# Patient Record
Sex: Female | Born: 1940 | Race: White | Hispanic: No | Marital: Married
Health system: Southern US, Community
[De-identification: ages and names within clinical notes are randomized; demographics above are authoritative.]

---

## 2006-09-01 ENCOUNTER — Emergency Department: Payer: Self-pay | Admitting: Emergency Medicine

## 2006-09-01 ENCOUNTER — Other Ambulatory Visit: Payer: Self-pay

## 2010-12-08 ENCOUNTER — Ambulatory Visit: Payer: Self-pay | Admitting: Oncology

## 2010-12-26 ENCOUNTER — Ambulatory Visit: Payer: Self-pay | Admitting: Oncology

## 2011-01-06 ENCOUNTER — Ambulatory Visit: Payer: Self-pay | Admitting: Oncology

## 2011-01-20 ENCOUNTER — Ambulatory Visit: Payer: Self-pay | Admitting: General Surgery

## 2011-01-22 ENCOUNTER — Ambulatory Visit: Payer: Self-pay | Admitting: General Surgery

## 2011-01-25 ENCOUNTER — Ambulatory Visit: Payer: Self-pay | Admitting: Oncology

## 2011-02-25 ENCOUNTER — Ambulatory Visit: Payer: Self-pay | Admitting: Oncology

## 2011-03-27 ENCOUNTER — Ambulatory Visit: Payer: Self-pay | Admitting: Oncology

## 2011-04-27 ENCOUNTER — Ambulatory Visit: Payer: Self-pay | Admitting: Oncology

## 2011-04-29 LAB — CBC CANCER CENTER
Basophil %: 0.4 %
Eosinophil #: 0 x10 3/mm (ref 0.0–0.7)
Eosinophil %: 5 %
HGB: 7.5 g/dL — ABNORMAL LOW (ref 12.0–16.0)
Lymphocyte #: 0.2 x10 3/mm — ABNORMAL LOW (ref 1.0–3.6)
Lymphocyte %: 43.8 %
MCHC: 33 g/dL (ref 32.0–36.0)
Monocyte #: 0.1 x10 3/mm (ref 0.0–0.7)
Neutrophil #: 0.1 x10 3/mm — ABNORMAL LOW (ref 1.4–6.5)
Neutrophil %: 24 %
WBC: 0.5 x10 3/mm — CL (ref 3.6–11.0)

## 2011-04-29 LAB — COMPREHENSIVE METABOLIC PANEL
Albumin: 3.4 g/dL (ref 3.4–5.0)
Anion Gap: 10 (ref 7–16)
BUN: 11 mg/dL (ref 7–18)
Bilirubin,Total: 0.4 mg/dL (ref 0.2–1.0)
Chloride: 96 mmol/L — ABNORMAL LOW (ref 98–107)
Creatinine: 0.95 mg/dL (ref 0.60–1.30)
EGFR (African American): >60
Glucose: 199 mg/dL — ABNORMAL HIGH (ref 65–99)
Potassium: 3.4 mmol/L — ABNORMAL LOW (ref 3.5–5.1)
SGOT(AST): 6 U/L — ABNORMAL LOW (ref 15–37)
SGPT (ALT): 15 U/L
Sodium: 135 mmol/L — ABNORMAL LOW (ref 136–145)
Total Protein: 5.8 g/dL — ABNORMAL LOW (ref 6.4–8.2)

## 2011-05-12 LAB — CBC CANCER CENTER
Basophil #: 0 x10 3/mm (ref 0.0–0.1)
HCT: 29 % — ABNORMAL LOW (ref 35.0–47.0)
Lymphocyte %: 11.7 %
Monocyte %: 6.8 %
Neutrophil #: 5.4 x10 3/mm (ref 1.4–6.5)
Neutrophil %: 81.1 %
Platelet: 155 x10 3/mm (ref 150–440)
RBC: 3.17 10*6/uL — ABNORMAL LOW (ref 3.80–5.20)
RDW: 15.8 % — ABNORMAL HIGH (ref 11.5–14.5)
WBC: 6.7 x10 3/mm (ref 3.6–11.0)

## 2011-05-12 LAB — COMPREHENSIVE METABOLIC PANEL
Albumin: 4 g/dL (ref 3.4–5.0)
Alkaline Phosphatase: 63 U/L (ref 50–136)
Bilirubin,Total: 0.2 mg/dL (ref 0.2–1.0)
Chloride: 96 mmol/L — ABNORMAL LOW (ref 98–107)
Co2: 28 mmol/L (ref 21–32)
Creatinine: 0.89 mg/dL (ref 0.60–1.30)
EGFR (African American): >60
EGFR (Non-African Amer.): >60
Glucose: 208 mg/dL — ABNORMAL HIGH (ref 65–99)
SGOT(AST): 16 U/L (ref 15–37)
SGPT (ALT): 19 U/L

## 2011-05-19 LAB — CBC CANCER CENTER
Basophil #: 0 x10 3/mm (ref 0.0–0.1)
Eosinophil #: 0 x10 3/mm (ref 0.0–0.7)
HCT: 26.5 % — ABNORMAL LOW (ref 35.0–47.0)
MCH: 30.1 pg (ref 26.0–34.0)
MCHC: 32.8 g/dL (ref 32.0–36.0)
MCV: 91.7 fL (ref 80–100)
Monocyte #: 0 x10 3/mm (ref 0.0–0.7)
Neutrophil #: 0 x10 3/mm — ABNORMAL LOW (ref 1.4–6.5)
Neutrophil %: 7.5 %
Platelet: 15 x10 3/mm — CL (ref 150–440)
RBC: 2.89 10*6/uL — ABNORMAL LOW (ref 3.80–5.20)
RDW: 15.2 % — ABNORMAL HIGH (ref 11.5–14.5)

## 2011-05-19 LAB — BASIC METABOLIC PANEL
Calcium, Total: 9 mg/dL (ref 8.5–10.1)
Chloride: 96 mmol/L — ABNORMAL LOW (ref 98–107)
Creatinine: 1.03 mg/dL (ref 0.60–1.30)
EGFR (Non-African Amer.): 56 — ABNORMAL LOW
Glucose: 199 mg/dL — ABNORMAL HIGH (ref 65–99)
Osmolality: 278 (ref 275–301)
Potassium: 3.8 mmol/L (ref 3.5–5.1)
Sodium: 135 mmol/L — ABNORMAL LOW (ref 136–145)

## 2011-05-28 ENCOUNTER — Ambulatory Visit: Payer: Self-pay | Admitting: Oncology

## 2011-06-25 ENCOUNTER — Ambulatory Visit: Payer: Self-pay | Admitting: Oncology

## 2011-07-07 LAB — CREATININE, SERUM
Creatinine: 0.85 mg/dL (ref 0.60–1.30)
EGFR (African American): >60

## 2011-07-26 ENCOUNTER — Ambulatory Visit: Payer: Self-pay | Admitting: Oncology

## 2011-07-28 LAB — CBC CANCER CENTER
Basophil #: 0 x10 3/mm (ref 0.0–0.1)
Basophil %: 0.3 %
Eosinophil #: 0.1 x10 3/mm (ref 0.0–0.7)
Eosinophil %: 1.4 %
HGB: 11.6 g/dL — ABNORMAL LOW (ref 12.0–16.0)
Lymphocyte %: 16 %
MCH: 29.4 pg (ref 26.0–34.0)
MCHC: 32.5 g/dL (ref 32.0–36.0)
Monocyte %: 8.1 %
Neutrophil #: 4.8 x10 3/mm (ref 1.4–6.5)
Neutrophil %: 74.2 %
Platelet: 181 x10 3/mm (ref 150–440)
RBC: 3.94 10*6/uL (ref 3.80–5.20)
RDW: 12.9 % (ref 11.5–14.5)
WBC: 6.4 x10 3/mm (ref 3.6–11.0)

## 2011-07-28 LAB — LACTATE DEHYDROGENASE: LDH: 164 U/L (ref 84–246)

## 2011-08-25 ENCOUNTER — Ambulatory Visit: Payer: Self-pay | Admitting: Oncology

## 2011-09-22 LAB — CBC CANCER CENTER
Basophil #: 0 x10 3/mm (ref 0.0–0.1)
Eosinophil #: 0 x10 3/mm (ref 0.0–0.7)
Lymphocyte %: 20 %
MCV: 88 fL (ref 80–100)
Monocyte #: 0.5 x10 3/mm (ref 0.2–0.9)
Monocyte %: 14.4 %
Neutrophil %: 63.5 %
Platelet: 156 x10 3/mm (ref 150–440)
RBC: 4.07 10*6/uL (ref 3.80–5.20)
WBC: 3.4 x10 3/mm — ABNORMAL LOW (ref 3.6–11.0)

## 2011-09-22 LAB — COMPREHENSIVE METABOLIC PANEL
Albumin: 4 g/dL (ref 3.4–5.0)
Alkaline Phosphatase: 65 U/L (ref 50–136)
Chloride: 101 mmol/L (ref 98–107)
Co2: 28 mmol/L (ref 21–32)
Creatinine: 0.91 mg/dL (ref 0.60–1.30)
EGFR (Non-African Amer.): >60
Osmolality: 281 (ref 275–301)
SGOT(AST): 16 U/L (ref 15–37)
SGPT (ALT): 21 U/L
Sodium: 138 mmol/L (ref 136–145)

## 2011-09-25 ENCOUNTER — Ambulatory Visit: Payer: Self-pay | Admitting: Oncology

## 2011-10-13 ENCOUNTER — Ambulatory Visit: Payer: Self-pay | Admitting: Oncology

## 2011-11-01 ENCOUNTER — Ambulatory Visit: Payer: Self-pay | Admitting: Oncology

## 2011-11-05 LAB — CBC CANCER CENTER
Comment - H1-Com3: NORMAL
Eosinophil: 2 %
HCT: 37.5 %
HGB: 12.2 g/dL
Lymphocytes: 17 %
MCH: 28.4 pg
MCHC: 32.6 g/dL
MCV: 87 fL
Monocytes: 4 %
Platelet: 175 "x10 3/mm "
RBC: 4.3 "x10 6/mm "
RDW: 13.8 %
Segmented Neutrophils: 77 %
WBC: 7 "x10 3/mm "

## 2011-11-22 LAB — CBC CANCER CENTER
Basophil #: 0 x10 3/mm (ref 0.0–0.1)
Basophil %: 0.5 %
Eosinophil #: 0.1 x10 3/mm (ref 0.0–0.7)
Lymphocyte #: 1 x10 3/mm (ref 1.0–3.6)
Lymphocyte %: 12.8 %
MCHC: 33.3 g/dL (ref 32.0–36.0)
MCV: 88 fL (ref 80–100)
Monocyte #: 0.6 x10 3/mm (ref 0.2–0.9)
Neutrophil #: 5.9 x10 3/mm (ref 1.4–6.5)
Platelet: 191 x10 3/mm (ref 150–440)
RBC: 3.88 10*6/uL (ref 3.80–5.20)
RDW: 13.8 % (ref 11.5–14.5)
WBC: 7.6 x10 3/mm (ref 3.6–11.0)

## 2011-11-22 LAB — COMPREHENSIVE METABOLIC PANEL
Albumin: 3.7 g/dL (ref 3.4–5.0)
Alkaline Phosphatase: 57 U/L (ref 50–136)
BUN: 18 mg/dL (ref 7–18)
Bilirubin,Total: 0.2 mg/dL (ref 0.2–1.0)
Chloride: 101 mmol/L (ref 98–107)
Creatinine: 0.87 mg/dL (ref 0.60–1.30)
EGFR (African American): >60
EGFR (Non-African Amer.): >60
Osmolality: 274 (ref 275–301)
Potassium: 4.4 mmol/L (ref 3.5–5.1)
SGOT(AST): 17 U/L (ref 15–37)
SGPT (ALT): 21 U/L
Sodium: 136 mmol/L (ref 136–145)
Total Protein: 6.6 g/dL (ref 6.4–8.2)

## 2011-11-25 ENCOUNTER — Ambulatory Visit: Payer: Self-pay | Admitting: Oncology

## 2011-12-08 LAB — CBC CANCER CENTER
Basophil #: 0 x10 3/mm (ref 0.0–0.1)
Basophil %: 0.2 %
Eosinophil %: 1.4 %
HGB: 12.6 g/dL (ref 12.0–16.0)
Lymphocyte #: 0.5 x10 3/mm — ABNORMAL LOW (ref 1.0–3.6)
Lymphocyte %: 7.2 %
MCH: 29.4 pg (ref 26.0–34.0)
MCHC: 33.1 g/dL (ref 32.0–36.0)
Monocyte %: 5.8 %
Neutrophil %: 85.4 %
Platelet: 176 x10 3/mm (ref 150–440)
WBC: 7 x10 3/mm (ref 3.6–11.0)

## 2011-12-14 LAB — COMPREHENSIVE METABOLIC PANEL
Anion Gap: 7 (ref 7–16)
BUN: 16 mg/dL (ref 7–18)
Chloride: 102 mmol/L (ref 98–107)
Creatinine: 0.89 mg/dL (ref 0.60–1.30)
EGFR (African American): >60
Glucose: 138 mg/dL — ABNORMAL HIGH (ref 65–99)
SGOT(AST): 15 U/L (ref 15–37)
SGPT (ALT): 21 U/L (ref 12–78)
Total Protein: 6.5 g/dL (ref 6.4–8.2)

## 2011-12-14 LAB — CBC CANCER CENTER
Basophil %: 0.3 %
Eosinophil #: 0 x10 3/mm (ref 0.0–0.7)
Lymphocyte #: 0.3 x10 3/mm — ABNORMAL LOW (ref 1.0–3.6)
MCH: 29.6 pg (ref 26.0–34.0)
Monocyte #: 0.4 x10 3/mm (ref 0.2–0.9)
Neutrophil %: 80.2 %
Platelet: 121 x10 3/mm — ABNORMAL LOW (ref 150–440)
RBC: 4.05 10*6/uL (ref 3.80–5.20)

## 2011-12-22 LAB — CBC CANCER CENTER
Basophil %: 0.3 %
Eosinophil %: 1.2 %
Lymphocyte %: 7.3 %
MCH: 29.5 pg (ref 26.0–34.0)
MCHC: 33.2 g/dL (ref 32.0–36.0)
MCV: 89 fL (ref 80–100)
Monocyte %: 7.2 %
Neutrophil #: 4.4 x10 3/mm (ref 1.4–6.5)
Platelet: 60 x10 3/mm — ABNORMAL LOW (ref 150–440)
RDW: 15.1 % — ABNORMAL HIGH (ref 11.5–14.5)

## 2011-12-26 ENCOUNTER — Ambulatory Visit: Payer: Self-pay | Admitting: Oncology

## 2011-12-29 LAB — CBC CANCER CENTER
Basophil #: 0 x10 3/mm (ref 0.0–0.1)
Basophil %: 0.8 %
Eosinophil #: 0 x10 3/mm (ref 0.0–0.7)
HCT: 35.5 % (ref 35.0–47.0)
HGB: 12.2 g/dL (ref 12.0–16.0)
Lymphocyte #: 0.5 x10 3/mm — ABNORMAL LOW (ref 1.0–3.6)
MCH: 30.3 pg (ref 26.0–34.0)
MCHC: 34.5 g/dL (ref 32.0–36.0)
MCV: 88 fL (ref 80–100)
Monocyte #: 0.2 x10 3/mm (ref 0.2–0.9)
Neutrophil #: 3.7 x10 3/mm (ref 1.4–6.5)
Neutrophil %: 81.9 %
Platelet: 38 x10 3/mm — ABNORMAL LOW (ref 150–440)
RBC: 4.05 10*6/uL (ref 3.80–5.20)
RDW: 14.8 % — ABNORMAL HIGH (ref 11.5–14.5)

## 2012-01-05 LAB — CBC CANCER CENTER
Basophil %: 1.5 %
Eosinophil %: 1.5 %
HGB: 11.6 g/dL — ABNORMAL LOW (ref 12.0–16.0)
Lymphocyte #: 0.5 x10 3/mm — ABNORMAL LOW (ref 1.0–3.6)
MCH: 29.8 pg (ref 26.0–34.0)
MCV: 89 fL (ref 80–100)
Monocyte #: 0.2 x10 3/mm (ref 0.2–0.9)
Platelet: 38 x10 3/mm — ABNORMAL LOW (ref 150–440)
RDW: 15.5 % — ABNORMAL HIGH (ref 11.5–14.5)
WBC: 2.8 x10 3/mm — ABNORMAL LOW (ref 3.6–11.0)

## 2012-01-07 LAB — CREATININE, SERUM: EGFR (Non-African Amer.): 50 — ABNORMAL LOW

## 2012-01-12 ENCOUNTER — Inpatient Hospital Stay: Payer: Self-pay | Admitting: Oncology

## 2012-01-12 LAB — CBC CANCER CENTER
Basophil #: 0 x10 3/mm (ref 0.0–0.1)
Eosinophil #: 0 x10 3/mm (ref 0.0–0.7)
HGB: 11.1 g/dL — ABNORMAL LOW (ref 12.0–16.0)
Lymphocyte #: 0.5 x10 3/mm — ABNORMAL LOW (ref 1.0–3.6)
Lymphocyte %: 24.6 %
MCHC: 34 g/dL (ref 32.0–36.0)
MCV: 89 fL (ref 80–100)
Monocyte %: 12.1 %
Neutrophil #: 1.4 x10 3/mm (ref 1.4–6.5)
Neutrophil %: 61.5 %
Platelet: 51 x10 3/mm — ABNORMAL LOW (ref 150–440)
RBC: 3.67 10*6/uL — ABNORMAL LOW (ref 3.80–5.20)
RDW: 15.5 % — ABNORMAL HIGH (ref 11.5–14.5)

## 2012-01-12 LAB — COMPREHENSIVE METABOLIC PANEL
Anion Gap: 10 (ref 7–16)
BUN: 26 mg/dL — ABNORMAL HIGH (ref 7–18)
Chloride: 99 mmol/L (ref 98–107)
Co2: 26 mmol/L (ref 21–32)
Creatinine: 1.09 mg/dL (ref 0.60–1.30)
EGFR (African American): 60 — ABNORMAL LOW
EGFR (Non-African Amer.): 51 — ABNORMAL LOW
Osmolality: 276 (ref 275–301)
Potassium: 3.9 mmol/L (ref 3.5–5.1)
SGOT(AST): 19 U/L (ref 15–37)
SGPT (ALT): 20 U/L (ref 12–78)
Sodium: 135 mmol/L — ABNORMAL LOW (ref 136–145)

## 2012-01-13 LAB — COMPREHENSIVE METABOLIC PANEL
Albumin: 3.7 g/dL (ref 3.4–5.0)
Alkaline Phosphatase: 52 U/L (ref 50–136)
Anion Gap: 8 (ref 7–16)
Calcium, Total: 9.2 mg/dL (ref 8.5–10.1)
Chloride: 104 mmol/L (ref 98–107)
Co2: 25 mmol/L (ref 21–32)
Creatinine: 0.68 mg/dL (ref 0.60–1.30)
EGFR (African American): >60
Glucose: 123 mg/dL — ABNORMAL HIGH (ref 65–99)
Osmolality: 276 (ref 275–301)
SGOT(AST): 22 U/L (ref 15–37)
SGPT (ALT): 24 U/L (ref 12–78)

## 2012-01-13 LAB — CBC WITH DIFFERENTIAL/PLATELET
Basophil %: 0.5 %
Eosinophil #: 0 10*3/uL (ref 0.0–0.7)
Eosinophil %: 1 %
HCT: 30.5 % — ABNORMAL LOW (ref 35.0–47.0)
HGB: 10.7 g/dL — ABNORMAL LOW (ref 12.0–16.0)
Lymphocyte #: 0.4 10*3/uL — ABNORMAL LOW (ref 1.0–3.6)
Lymphocyte %: 19.4 %
MCH: 30.3 pg (ref 26.0–34.0)
MCHC: 35.3 g/dL (ref 32.0–36.0)
MCV: 86 fL (ref 80–100)
Monocyte #: 0.2 x10 3/mm (ref 0.2–0.9)
Monocyte %: 10.9 %
Neutrophil %: 68.2 %
Platelet: 48 10*3/uL — ABNORMAL LOW (ref 150–440)
RBC: 3.54 10*6/uL — ABNORMAL LOW (ref 3.80–5.20)

## 2012-01-18 LAB — CULTURE, BLOOD (SINGLE)

## 2012-01-19 LAB — CBC CANCER CENTER
Basophil %: 0.6 %
Eosinophil %: 0.4 %
HCT: 32 % — ABNORMAL LOW (ref 35.0–47.0)
HGB: 10.9 g/dL — ABNORMAL LOW (ref 12.0–16.0)
Lymphocyte #: 0.5 x10 3/mm — ABNORMAL LOW (ref 1.0–3.6)
Lymphocyte %: 17.1 %
MCHC: 34.1 g/dL (ref 32.0–36.0)
MCV: 89 fL (ref 80–100)
Monocyte #: 0.3 x10 3/mm (ref 0.2–0.9)
Monocyte %: 11.1 %
Neutrophil #: 1.9 x10 3/mm (ref 1.4–6.5)
RBC: 3.61 10*6/uL — ABNORMAL LOW (ref 3.80–5.20)
RDW: 16 % — ABNORMAL HIGH (ref 11.5–14.5)
WBC: 2.6 x10 3/mm — ABNORMAL LOW (ref 3.6–11.0)

## 2012-01-25 ENCOUNTER — Ambulatory Visit: Payer: Self-pay | Admitting: Oncology

## 2012-01-26 LAB — CBC CANCER CENTER
Basophil #: 0 x10 3/mm (ref 0.0–0.1)
Eosinophil %: 0.1 %
HCT: 31.3 % — ABNORMAL LOW (ref 35.0–47.0)
HGB: 10.6 g/dL — ABNORMAL LOW (ref 12.0–16.0)
Lymphocyte %: 7.4 %
MCHC: 33.9 g/dL (ref 32.0–36.0)
MCV: 89 fL (ref 80–100)
Monocyte #: 0.5 x10 3/mm (ref 0.2–0.9)
Monocyte %: 11.4 %
RDW: 16.7 % — ABNORMAL HIGH (ref 11.5–14.5)
WBC: 4.2 x10 3/mm (ref 3.6–11.0)

## 2012-02-09 LAB — CBC CANCER CENTER
Basophil #: 0 x10 3/mm (ref 0.0–0.1)
Eosinophil #: 0 x10 3/mm (ref 0.0–0.7)
HCT: 32.1 % — ABNORMAL LOW (ref 35.0–47.0)
HGB: 10.6 g/dL — ABNORMAL LOW (ref 12.0–16.0)
Lymphocyte #: 0.5 x10 3/mm — ABNORMAL LOW (ref 1.0–3.6)
Lymphocyte %: 5 %
MCHC: 33.1 g/dL (ref 32.0–36.0)
MCV: 90 fL (ref 80–100)
Monocyte %: 7 %
Neutrophil #: 8.4 x10 3/mm — ABNORMAL HIGH (ref 1.4–6.5)
Neutrophil %: 87.5 %
Platelet: 144 x10 3/mm — ABNORMAL LOW (ref 150–440)
RBC: 3.56 10*6/uL — ABNORMAL LOW (ref 3.80–5.20)
RDW: 16.9 % — ABNORMAL HIGH (ref 11.5–14.5)

## 2012-02-16 LAB — CBC CANCER CENTER
Basophil #: 0 x10 3/mm (ref 0.0–0.1)
Eosinophil #: 0 x10 3/mm (ref 0.0–0.7)
Eosinophil %: 0.7 %
HGB: 10.8 g/dL — ABNORMAL LOW (ref 12.0–16.0)
Lymphocyte #: 0.3 x10 3/mm — ABNORMAL LOW (ref 1.0–3.6)
Lymphocyte %: 3.7 %
Monocyte #: 0.5 x10 3/mm (ref 0.2–0.9)
Neutrophil %: 87.6 %
RBC: 3.46 10*6/uL — ABNORMAL LOW (ref 3.80–5.20)
RDW: 16.6 % — ABNORMAL HIGH (ref 11.5–14.5)
WBC: 7.2 x10 3/mm (ref 3.6–11.0)

## 2012-02-23 LAB — CREATININE, SERUM: EGFR (Non-African Amer.): >60

## 2012-02-23 LAB — CBC CANCER CENTER
Basophil %: 0.2 %
Eosinophil #: 0 x10 3/mm (ref 0.0–0.7)
HCT: 30.5 % — ABNORMAL LOW (ref 35.0–47.0)
HGB: 10.3 g/dL — ABNORMAL LOW (ref 12.0–16.0)
Lymphocyte #: 0.2 x10 3/mm — ABNORMAL LOW (ref 1.0–3.6)
Lymphocyte %: 3 %
MCH: 31.3 pg (ref 26.0–34.0)
MCHC: 33.8 g/dL (ref 32.0–36.0)
MCV: 93 fL (ref 80–100)
Monocyte #: 0.3 x10 3/mm (ref 0.2–0.9)
Neutrophil %: 91.9 %
Platelet: 121 x10 3/mm — ABNORMAL LOW (ref 150–440)
RBC: 3.29 10*6/uL — ABNORMAL LOW (ref 3.80–5.20)

## 2012-02-25 ENCOUNTER — Ambulatory Visit: Payer: Self-pay | Admitting: Oncology

## 2012-02-25 ENCOUNTER — Inpatient Hospital Stay: Payer: Self-pay | Admitting: Internal Medicine

## 2012-02-25 LAB — COMPREHENSIVE METABOLIC PANEL
Alkaline Phosphatase: 74 U/L (ref 50–136)
BUN: 20 mg/dL — ABNORMAL HIGH (ref 7–18)
Bilirubin,Total: 0.3 mg/dL (ref 0.2–1.0)
Chloride: 91 mmol/L — ABNORMAL LOW (ref 98–107)
Co2: 32 mmol/L (ref 21–32)
Creatinine: 0.83 mg/dL (ref 0.60–1.30)
EGFR (Non-African Amer.): >60
Glucose: 296 mg/dL — ABNORMAL HIGH (ref 65–99)
SGOT(AST): 16 U/L (ref 15–37)
SGPT (ALT): 19 U/L (ref 12–78)
Sodium: 131 mmol/L — ABNORMAL LOW (ref 136–145)
Total Protein: 7.3 g/dL (ref 6.4–8.2)

## 2012-02-25 LAB — CBC
HCT: 32 % — ABNORMAL LOW (ref 35.0–47.0)
HGB: 11.2 g/dL — ABNORMAL LOW (ref 12.0–16.0)
MCHC: 35.1 g/dL (ref 32.0–36.0)
RBC: 3.55 10*6/uL — ABNORMAL LOW (ref 3.80–5.20)
WBC: 13.2 10*3/uL — ABNORMAL HIGH (ref 3.6–11.0)

## 2012-02-25 LAB — PROTIME-INR: Prothrombin Time: 12.5 secs (ref 11.5–14.7)

## 2012-02-26 LAB — URINALYSIS, COMPLETE
Ketone: NEGATIVE
Nitrite: POSITIVE
Ph: 5 (ref 4.5–8.0)
Protein: 30
RBC,UR: 2 /HPF (ref 0–5)
Specific Gravity: 1.031 (ref 1.003–1.030)
WBC UR: 2 /HPF (ref 0–5)

## 2012-02-26 LAB — BASIC METABOLIC PANEL
BUN: 28 mg/dL — ABNORMAL HIGH (ref 7–18)
Chloride: 104 mmol/L (ref 98–107)
Co2: 26 mmol/L (ref 21–32)
EGFR (African American): >60
EGFR (Non-African Amer.): >60
Osmolality: 296 (ref 275–301)
Potassium: 3.4 mmol/L — ABNORMAL LOW (ref 3.5–5.1)

## 2012-02-26 LAB — MAGNESIUM: Magnesium: 1.3 mg/dL — ABNORMAL LOW

## 2012-02-27 LAB — CBC WITH DIFFERENTIAL/PLATELET
Basophil %: 0.2 %
Eosinophil %: 0 %
HCT: 26 % — ABNORMAL LOW (ref 35.0–47.0)
HGB: 9.2 g/dL — ABNORMAL LOW (ref 12.0–16.0)
Lymphocyte #: 0.1 10*3/uL — ABNORMAL LOW (ref 1.0–3.6)
MCV: 92 fL (ref 80–100)
Monocyte %: 1.3 %
Neutrophil #: 4.4 10*3/uL (ref 1.4–6.5)
RBC: 2.84 10*6/uL — ABNORMAL LOW (ref 3.80–5.20)
WBC: 4.6 10*3/uL (ref 3.6–11.0)

## 2012-02-27 LAB — MAGNESIUM: Magnesium: 2.4 mg/dL

## 2012-02-27 LAB — PROTIME-INR: Prothrombin Time: 16.3 secs — ABNORMAL HIGH (ref 11.5–14.7)

## 2012-02-27 LAB — APTT: Activated PTT: 40.3 secs — ABNORMAL HIGH (ref 23.6–35.9)

## 2012-02-28 LAB — COMPREHENSIVE METABOLIC PANEL
Anion Gap: 9 (ref 7–16)
Bilirubin,Total: 0.7 mg/dL (ref 0.2–1.0)
Calcium, Total: 8.8 mg/dL (ref 8.5–10.1)
Chloride: 103 mmol/L (ref 98–107)
Co2: 27 mmol/L (ref 21–32)
Creatinine: 0.9 mg/dL (ref 0.60–1.30)
EGFR (African American): >60
EGFR (Non-African Amer.): >60
Potassium: 3.8 mmol/L (ref 3.5–5.1)
SGOT(AST): 53 U/L — ABNORMAL HIGH (ref 15–37)
SGPT (ALT): 38 U/L (ref 12–78)
Total Protein: 5.7 g/dL — ABNORMAL LOW (ref 6.4–8.2)

## 2012-02-28 LAB — CBC WITH DIFFERENTIAL/PLATELET
Basophil %: 0.3 %
Eosinophil %: 0.1 %
HCT: 24.6 % — ABNORMAL LOW (ref 35.0–47.0)
HGB: 8.5 g/dL — ABNORMAL LOW (ref 12.0–16.0)
Lymphocyte %: 1.4 %
MCH: 31.6 pg (ref 26.0–34.0)
Monocyte #: 0.1 x10 3/mm — ABNORMAL LOW (ref 0.2–0.9)
Neutrophil #: 5.1 10*3/uL (ref 1.4–6.5)
Neutrophil %: 96.8 %
RBC: 2.68 10*6/uL — ABNORMAL LOW (ref 3.80–5.20)
WBC: 5.3 10*3/uL (ref 3.6–11.0)

## 2012-02-28 LAB — URINE CULTURE

## 2012-02-29 LAB — CBC WITH DIFFERENTIAL/PLATELET
Basophil %: 0.4 %
Eosinophil #: 0 10*3/uL (ref 0.0–0.7)
Eosinophil %: 0.1 %
HGB: 8.2 g/dL — ABNORMAL LOW (ref 12.0–16.0)
Lymphocyte %: 1.3 %
MCH: 31.8 pg (ref 26.0–34.0)
MCHC: 34.1 g/dL (ref 32.0–36.0)
Neutrophil #: 5 10*3/uL (ref 1.4–6.5)
Neutrophil %: 95.6 %
Platelet: 27 10*3/uL — CL (ref 150–440)
RBC: 2.57 10*6/uL — ABNORMAL LOW (ref 3.80–5.20)
RDW: 16.5 % — ABNORMAL HIGH (ref 11.5–14.5)
WBC: 5.2 10*3/uL (ref 3.6–11.0)

## 2012-03-01 LAB — COMPREHENSIVE METABOLIC PANEL
Albumin: 2 g/dL — ABNORMAL LOW (ref 3.4–5.0)
Anion Gap: 12 (ref 7–16)
BUN: 31 mg/dL — ABNORMAL HIGH (ref 7–18)
Calcium, Total: 8.8 mg/dL (ref 8.5–10.1)
EGFR (Non-African Amer.): >60
Glucose: 280 mg/dL — ABNORMAL HIGH (ref 65–99)
Potassium: 3.3 mmol/L — ABNORMAL LOW (ref 3.5–5.1)
SGOT(AST): 22 U/L (ref 15–37)
SGPT (ALT): 55 U/L (ref 12–78)
Total Protein: 5.1 g/dL — ABNORMAL LOW (ref 6.4–8.2)

## 2012-03-01 LAB — CBC WITH DIFFERENTIAL/PLATELET
HCT: 22.8 % — ABNORMAL LOW (ref 35.0–47.0)
Lymphocytes: 1 %
MCH: 31.2 pg (ref 26.0–34.0)
MCV: 93 fL (ref 80–100)
Monocytes: 8 %
RBC: 2.46 10*6/uL — ABNORMAL LOW (ref 3.80–5.20)
RDW: 16.4 % — ABNORMAL HIGH (ref 11.5–14.5)
Segmented Neutrophils: 87 %
WBC: 4.9 10*3/uL (ref 3.6–11.0)

## 2012-03-01 LAB — EXPECTORATED SPUTUM ASSESSMENT W GRAM STAIN, RFLX TO RESP C

## 2012-03-26 ENCOUNTER — Ambulatory Visit: Payer: Self-pay | Admitting: Oncology

## 2012-03-26 DEATH — deceased

## 2013-08-08 IMAGING — CT CT NECK-CHEST-ABD-PELV W/ CM
2 of 3 series · 7 of 14 positions shown, 8 images · non-contrast
Comparison: none

REASON FOR EXAM: restaging lymphoma
COMMENTS:

[Series 2: soft tissue · axial · 0.67mm/px · z∈[+342,+552]mm · 3 of 82 slices shown (1 of 2)]
[im 21/82  soft-tissue]
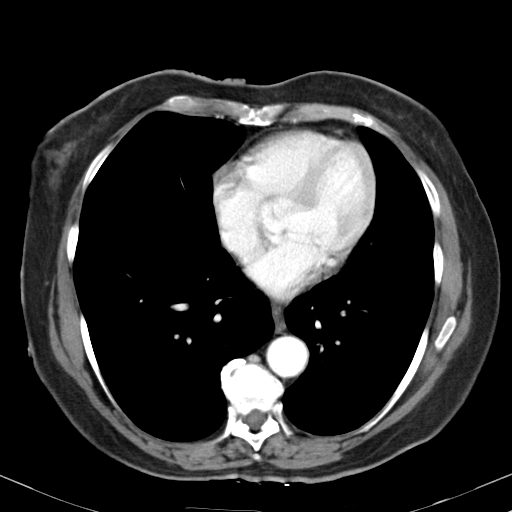
[im 41/82  soft-tissue]
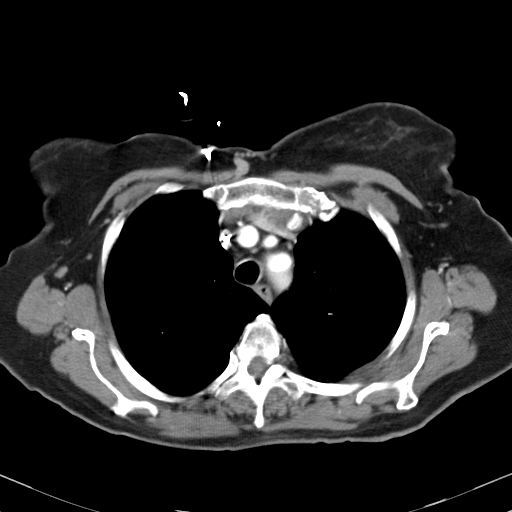
[im 61/82  soft-tissue]
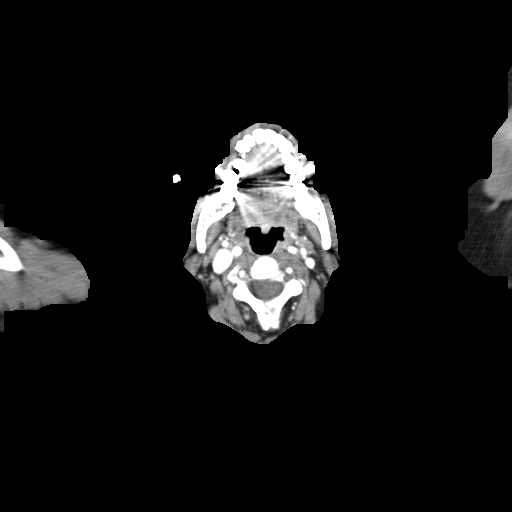

[Series 3: soft tissue · axial · 0.67mm/px · z∈[+11,+266]mm · 4 of 86 slices shown, 5 images (2 of 2)]
[im 18/86  soft-tissue]
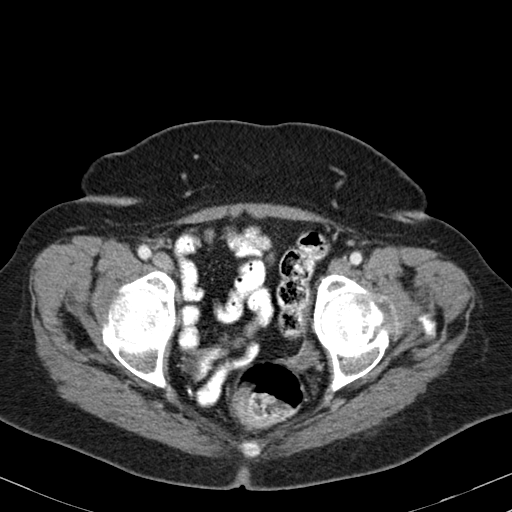
[im 18/86  bone]
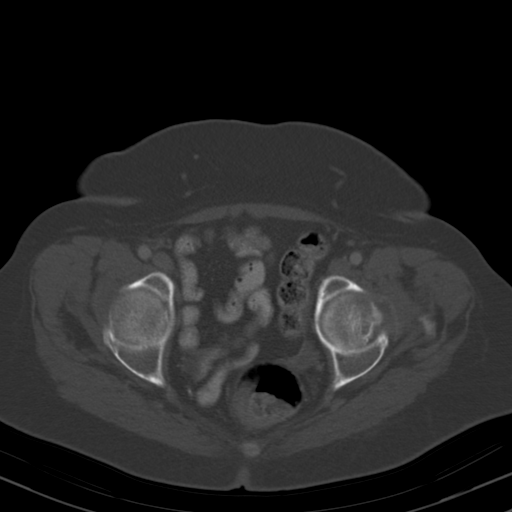
[im 35/86  soft-tissue]
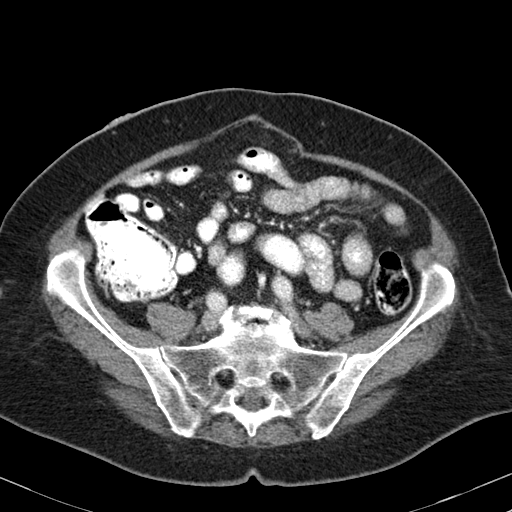
[im 52/86  soft-tissue]
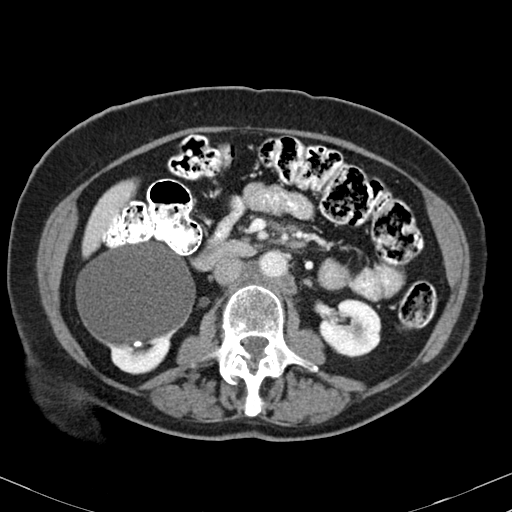
[im 69/86  soft-tissue]
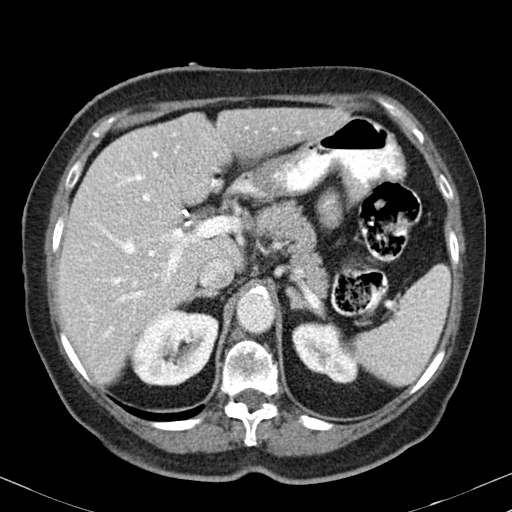

[7 of 14 positions shown; findings below may reference images not displayed]

PROCEDURE:     REFORMAS - REFORMAS NECK CHEST ABD/PEL W  - July 07, 2011 [DATE]

RESULT:     Axial CT scanning was performed through the neck, chest, abdomen
and pelvis with reconstructions at 5 mm intervals and slice thicknesses
following intravenous administration of 100 cc of [id number]. The patient
also received oral contrast material. Review of multiplanar reconstructed
images was performed separately on the VIA monitor.

CT scan of the neck: I see no pathologic sized anterior or posterior
cervical lymph nodes. No submandibular lymphadenopathy is seen. The parotid
and submandibular glands are normal in appearance. There is fluid in the
right maxillary sinus. The nasal passages appear patent. The oro- pharyngeal
structures and hypopharyngeal structures are grossly normal. The laryngeal
structures exhibit no acute abnormality. There is heterogeneous density
within the right thyroid lobe. The cervical vertebral bodies are preserved
in height. There is mild degenerative disc change at multiple cervical disc
levels.
CONCLUSION: I do not see findings to suggest active lymphoma within the neck.

CT scan of the chest: At mediastinal window settings I see no
lymphadenopathy. The cardiac chambers are normal in size. The caliber of the
thoracic aorta is normal. There is a small hiatal hernia. There is no
pleural nor pericardial effusion. At lung window settings there is apical
pleural scarring bilaterally. I see no interstitial nor alveolar
infiltrates. No suspicious pulmonary parenchymal nodules are demonstrated.
The thoracic vertebral bodies are preserved in height. Degenerative disc
change is seen at multiple levels.
CONCLUSION: There are emphysematous changes in both lungs. I do not see
evidence of recurrent lymphoma.

CT scan of the abdomen and pelvis:

The liver exhibits no focal mass nor ductal dilation. The gallbladder is
surgically absent. The pancreas, spleen, partially distended stomach, and
adrenal glands are normal in appearance. There are dominant hypodensities
associated with the left kidney most compatible with cysts. The largest
arises from the anterior aspect of the mid and lower pole of the right
kidney with HU measurement of +11 and measures 8 cm in diameter. Neither
kidney exhibits evidence of obstruction.

On image 34 through 49 in the left mid abdomen there is abnormal soft tissue
density encasing mesenteric vessels. The abnormality lies below the level of
the pancreas and anterior and inferior to the lower pole of the left kidney.
The poorly defined mass measures 2.9 cm AP x 4.4 cm transversely. This is
worrisome for active lymphoma. There is enlargement of a left periaortic
lymph node seen on image 24 at the level of the left renal vein. This node
measures approximately 1.6 cm in diameter. There is mild enlargement of a
right sided lymph node posterior to the inferior vena cava on image 26 as
well and measures 1.3 cm in diameter. I do not see lymphadenopathy
surrounding the pancreas nor in the porta hepatis nor in the gastric bed.
Additional periaortic lymph nodes are present on the left. I do not see
bulky pelvic or internal or external iliac lymph nodes. No enlarged inguinal
lymph nodes are demonstrated.

There is an umbilical hernia containing mesenteric fat but no bowel. There
is no inguinal hernia. The partially contrast-filled loops of small and
large bowel exhibit no evidence of ileus nor obstruction. The partially
distended urinary bladder is normal in appearance. The uterus is surgically
absent. I see no adnexal masses. The lumbar vertebral bodies are preserved
in height.
IMPRESSION: 1. Please see the discussion above regarding findings in the neck and chest.
The lymphadenopathy previously demonstrated within the neck and mediastinum
and hilar regions has resolved.
2. Within the abdomen and pelvis there are mildly enlarged retroperitoneal
lymph nodes in the periaortic and pericaval regions. There is no
splenomegaly. There is abnormal soft tissue density encasing mesenteric
vessels to the left of midline in the mid abdomen demonstrated on images 34
through 47 that is worrisome for active lymphoma. These abnormalities have
markedly decreased in conspicuity since the previous PET/CT of [DATE],

## 2014-01-15 IMAGING — CR DG CHEST 2V
1 series · 2 of 2 positions shown · non-contrast
Comparison: none

REASON FOR EXAM: cxr dyspnea lymphoma
COMMENTS:

[Series 1: pa · 0.17mm/px · 2 of 2 slices shown]
[im 1/2]
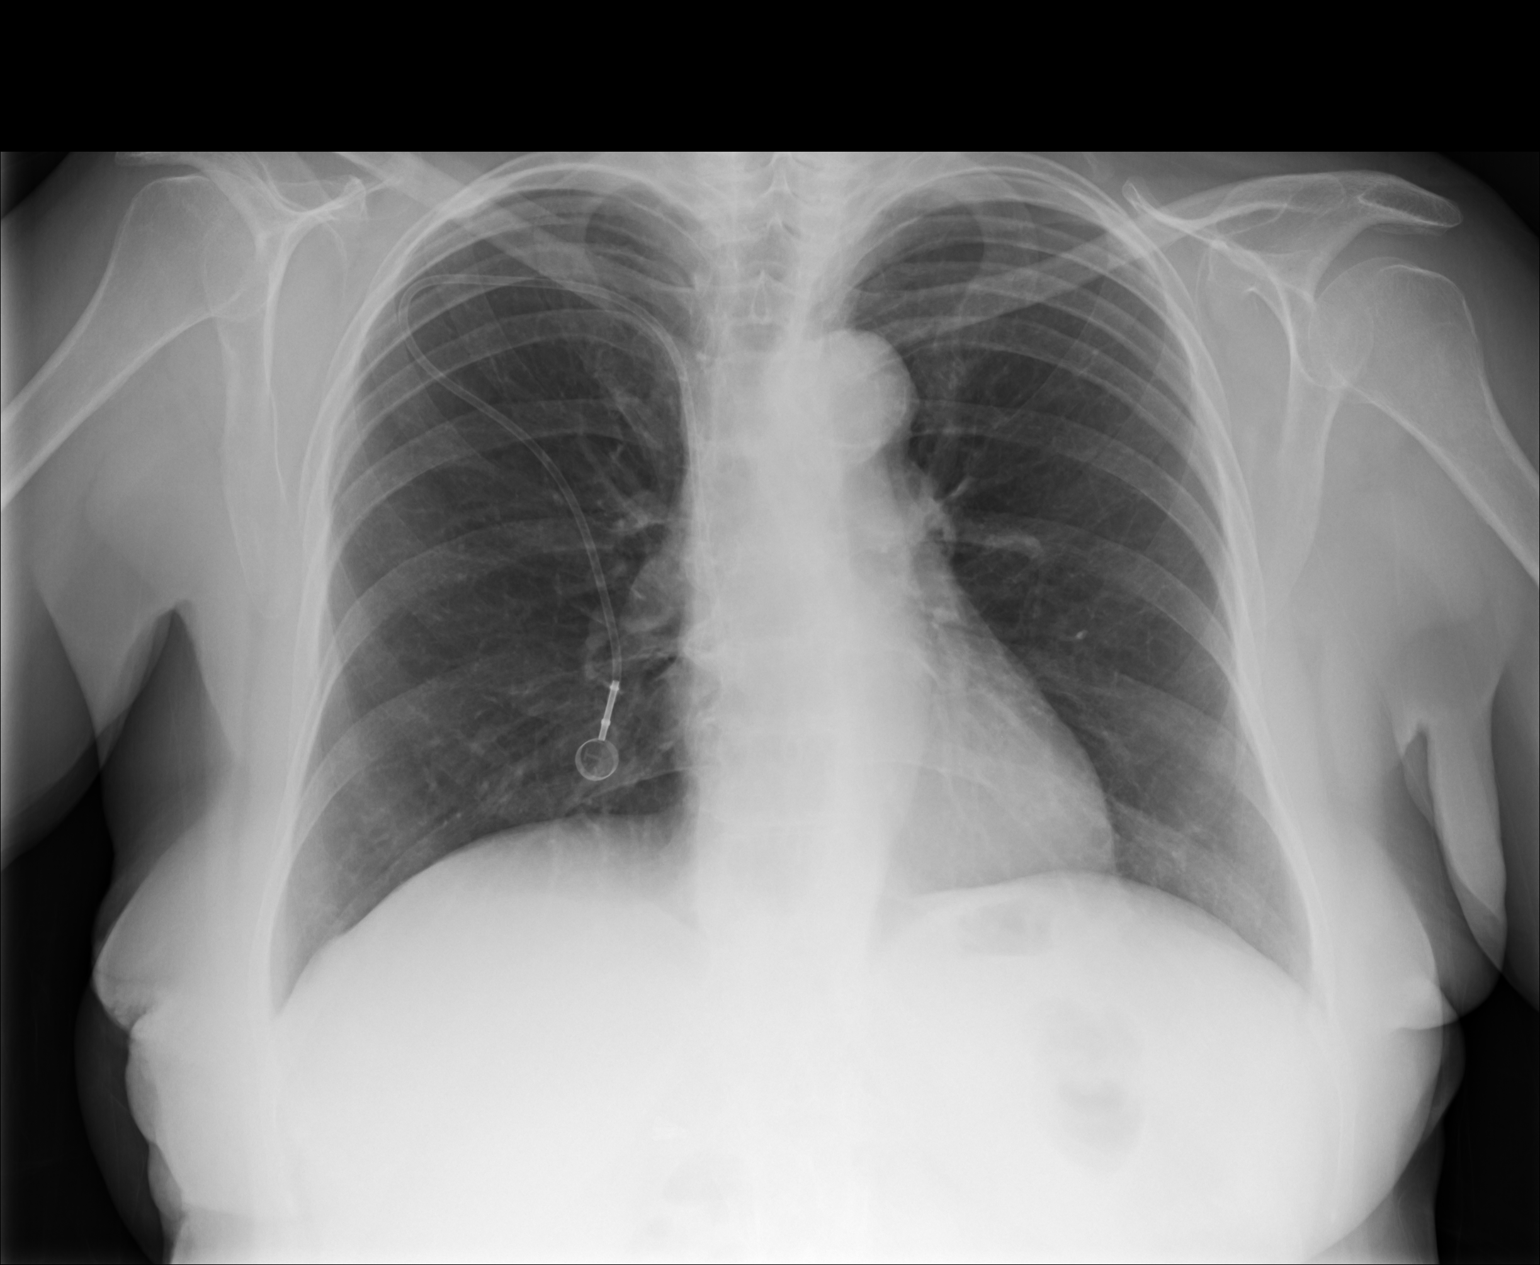
[im 2/2]
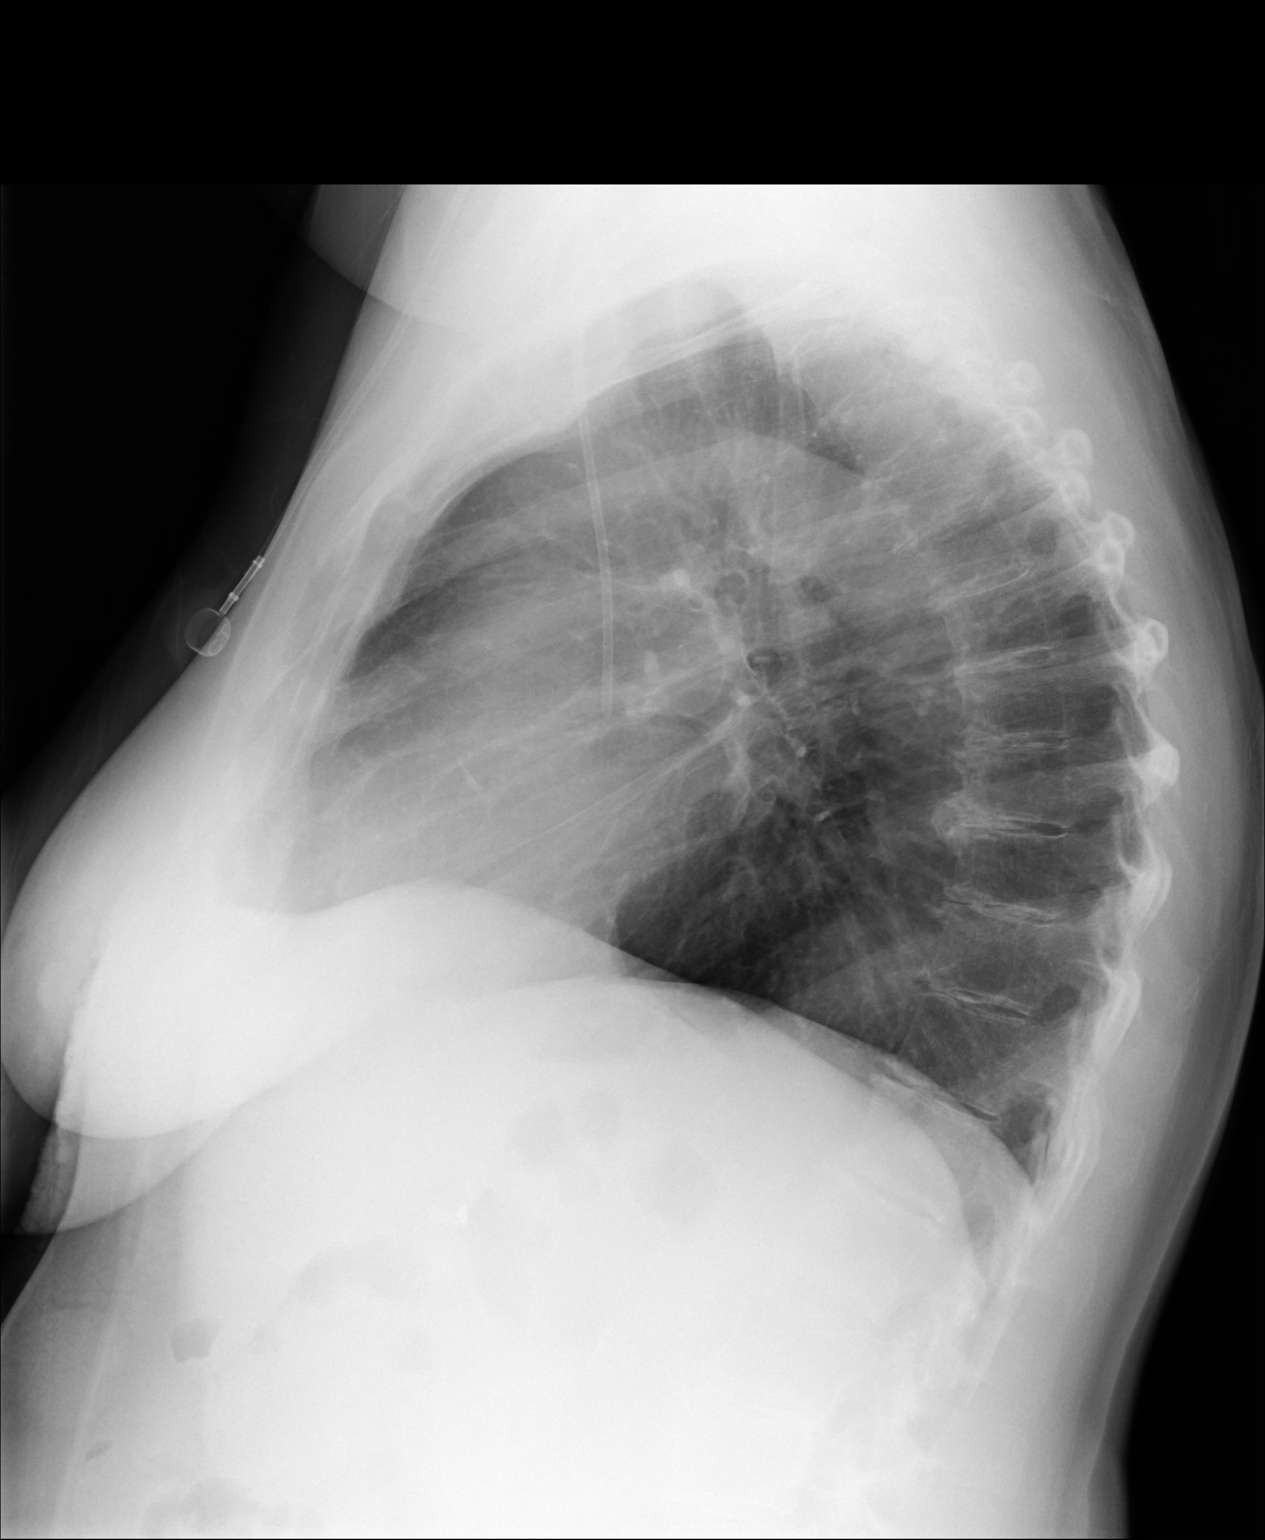

[2 of 2 positions shown; findings below may reference images not displayed]

PROCEDURE:     MDR - MDR CHEST PA(OR AP) AND LATERAL  - December 14, 2011 [DATE]

RESULT:     Comparison made to study 22 January, 2011.

The lungs are adequately inflated. There is no focal infiltrate. The cardiac
silhouette is normal in size. The pulmonary vascularity is not engorged.
There is linear density in the left perihilar region which is not entirely
new. A Port-A-Cath appliance is in place. There is no pleural effusion. The
mediastinum is normal in width. There is mild tortuosity of the descending
thoracic aorta. There is degenerative disc change at multiple levels of the
thoracic spine.
IMPRESSION: There is no evidence of acute cardiopulmonary abnormality.

[REDACTED]

## 2014-08-13 NOTE — Discharge Summary (Signed)
PATIENT NAME:  Penny Bradley, Penny Bradley MR#:  [id number] DATE OF BIRTH:  1941/04/01  DATE OF ADMISSION:  02/25/2012 DATE OF DISCHARGE:  03/02/2012  ADMITTING DIAGNOSIS: Acute respiratory failure.   DISCHARGE DIAGNOSES:  1. 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  2. Recurrent follicular lymphoma.  3. Hyponatremia.  4. Hypotension. 5. Diabetes mellitus. 6. History of hypertension.  7. Hypokalemia.  8. Metabolic encephalopathy.  9. Urinary tract infection Citrobacter freundii.   10. Thrombocytopenia.  11. Ventricular tachycardia. 12. History of hyperlipidemia. 13. Diabetes. 14. Cholecystectomy. 15. Hysterectomy.   DISCHARGE CONDITION: Poor.   DISPOSITION: Patient is being discharged to hospice home.   OXYGEN: She is being discharged on continued oxygen through continuous trach collar.   DIET: As tolerated.   MEDICATIONS:  1. Roxanol 0.25 to 0.5 mL p.o. or sublingually every 1 to 2 hours as needed.  2. Lorazepam 0.5 to 1 mg p.o. or sublingually every 2 to 4 hours as needed. 3. Ranitidine 150 mg p.o. twice daily.  4. ABHR suppository one per rectum every 4 to 6 hours as needed.  5. Morphine PCA 5 mg in 1 mL solution continuous infusion at 6 mg an hour.   CONSULTANTS:  1. Dr. Harvie Junior. 2. Dr. Erin Fulling.  3. Dr. Orlie Dakin.  4. Care management.  5. Dr. Elenore Rota.    LABORATORY, DIAGNOSTIC AND RADIOLOGICAL DATA: Chest x-ray, portable single view 02/25/2012 revealed no evidence of acute cardiopulmonary disease. Repeated chest x-ray 02/26/2012 revealed interval tracheostomy, mild cardiomegaly with diffuse interstitial edema. Repeated chest x-ray, portable single view, 02/28/2012 showed presumed bilateral pneumonia, cardiomegaly, tracheostomy present. Correlate with clinical and laboratory data. Portable  chest x-ray after PICC line placement 02/28/2012 no post procedure complications demonstrated following placement of PICC line. Abdomen AP only, after Dobbhoff placement 02/28/2012 Dobbhoff type feeding tube mercury bulb lies in the region of distal stomach or proximal duodenum according to radiologist. Chest, portable single view 02/29/2012 x-ray tracheostomy is present. Patchy density is present in left upper lung with ill-defined increased density in right lung. Port-A-Cath device is present with the tip of the catheter in right atrium. Nasogastric tube appears to pass below diaphragm in the stomach. No large effusion is present. Small right effusion is not excluded. Bony structures appear intact. There is a peripherally inserted left-sided central venous catheter with the tip in the superior vena cava. Impression: Patchy bilateral infiltrates, Port-A-Cath device and left peripherally inserted central catheter as well as tracheostomy tube and Dobbhoff tube were present. Repeated chest x-ray, portable single view 03/01/2012 essentially stable appearance. CT scan of neck and chest with contrast 02/25/2012 showed slight interval resolution of cervical adenopathy, significant progression of intrathoracic and intra-abdominal periaortic adenopathy and subcarinal adenopathy. Multiple new pulmonary nodules, slight regression of left anterior upper abdominal wall subcutaneous soft tissue mass.   HOSPITAL COURSE: Patient is a 74 year old Caucasian female with history of lymphoma who presented to the hospital with complaints of shortness of breath as well as stridor. Please refer to Dr. Teena Irani admission note on 02/25/2012. Patient was very anxious, hypoxic. She had CT of her neck done with IV contrast with which showed patent airway and multiple lung nodules and adenopathy as well as narrowing of bronchus to 3 mm. It was negative for PE and had stridor. ENT consultation was requested and patient underwent  tracheostomy placement because of vocal cord paralysis. Post procedure  she did satisfactory initially, however, later desaturated had to be hooked up to the ventilator. She was noted to have hospital-acquired pneumonia. She progressively got worse, deteriorated as time progressed. She was consulted by palliative care physician, Dr. Harvie JuniorPhifer, and family decided to start comfort care measures and transfer patient to hospice home where she will be transferred today on 03/02/2012. Patient's vital signs on the day of discharge: Temperature 102.6, pulse 118, respiration rate 16, blood pressure 117/75, saturation 95% on 70% trach collar.  TIME SPENT: 40 minutes.  ____________________________ Katharina Caperima Kairee Kozma, MD rv:cms D: 03/02/2012 16:46:20 ET T: 03/03/2012 12:43:20 ET JOB#: [id number]  cc: Katharina Caperima Eimy Plaza, MD, <Dictator>  Sharonna Vinje MD ELECTRONICALLY SIGNED 03/10/2012 13:03

## 2014-08-13 NOTE — Consult Note (Signed)
Comments   I met with pt's family including husband, son, daughter and mother. Updated them on pt's condition. They all understand that pt is declining and is approaching the end of life. Husband asked to meet with us with only daughter and son. Husband was then able to tell us that pt would not want this and he is ready for comfort care. Orders entered. Will proceed with extubation and transfer to 1C so that family can be with pt.   Electronic Signatures: Phifer, Nancy (MD)  (Signed 06-Nov-13 13:25)  Authored: Palliative Care   Last Updated: 06-Nov-13 13:25 by Phifer, Nancy (MD) 

## 2014-08-13 NOTE — Consult Note (Signed)
Reason for Visit: This 74 year old Female patient presents to the clinic for initial evaluation of  Follicular lymphoma .   Referred by Dr. Grayland Ormond.  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 This was 1st noticed by her primary care physician. She went on to have a biopsy positive for CD20 B-cell lymphoma follicular type. By PET/CT she had stage III disease.her bone marrow was normal. She had disease primarily in her neck although disease on both sides of her diaphragm.she underwent R. CHOP x6 cycles. She has also been on Rituxan maintenance for 2 cycles. Repeat PET/CT scan shows persistent diseaseon both sides of the diaphragm although the bulk of the disease has diminished. She has O. overall feeling well. No fever chills night sweats. By mouth intake is good. She is having no pain. She recently was seen in the emergency room for bladder infection which has resolved. She seen today for consideration of Zevalin therapy. She has had a bone marrow biopsy within the past 2 week showing no evidence of lymphoma. Her platelet counts have consistently been up of 150,000  Past Hx:    lymphoma:    High cholestrol:    HTN:    DM:    cholecystectomy:    hysterectomy:   Past, Family and Social History:   Past Medical History positive    Cardiovascular hyperlipidemia; hypertension    Endocrine diabetes mellitus    Past Surgical History cholecystectomy; Hysterectomy    Family History positive    Family History Comments Hypertension and cardiovascular heart disease     Social History positive    Social History Comments 40-pack-year smoking history no EtOH abuse history    Additional Past Medical and Surgical History Accompanied by her mother today   Allergies:   No Known Allergies:   Home Meds:  Home Medications: Medication Instructions Status  benazepril 10 mg oral tablet 1 tab(s) orally once a day in AM Active  citalopram 20 mg oral tablet 1 tab(s) orally once a day in AM Active  metformin 1000 mg oral tablet 1 tab oral BID Active  aspirin 81 mg oral tablet 1 tab(s) orally once a day in AM Active  Tylenol 325 mg oral tablet 2 tab(s) orally every 4 hours, As Needed Hold while on norco Active  simvastatin 20 mg oral tablet 1 tab(s) orally once a day (at bedtime) Active   Review of Systems:   General negative    Performance Status (ECOG) 0    Skin negative    Breast negative    Ophthalmologic negative    ENMT negative    Respiratory and Thorax negative    Cardiovascular negative    Gastrointestinal negative    Genitourinary negative    Musculoskeletal negative    Neurological negative    Psychiatric negative    Hematology/Lymphatics see HPI    Endocrine see HPI    Allergic/Immunologic negative   Nursing Notes:  Nursing Vital Signs and Chemo Nursing Nursing Notes: *CC Vital Signs Flowsheet:   22-Jul-13 08:05   Temp Temperature 98.1   Pulse Pulse 73  Respirations Respirations 20   SBP SBP 120   DBP DBP 73   Pain Scale (0-10)  6   Current Weight (kg) (kg) 73.6   Height (cm) centimeters 157.4   BSA (m2) 1.7   Physical Exam:  General/Skin/HEENT:   General normal    Skin normal    Eyes normal    Additional PE Well-developed slightly obese female in NAD. She has prominent lymphadenopathy in her left cervical chain extending to the left supraclavicular fossa. She also has abdominal nodularity present in her lower quadrant. She also has some shotty lymphadenopathy in her axilla bilaterally. Lungs are clear to A&P  cardiac examination shows regular rate and rhythm.   Breasts/Resp/CV/GI/GU:   Respiratory and Thorax normal    Cardiovascular normal    Gastrointestinal normal    Genitourinary normal   MS/Neuro/Psych/Lymph:   Musculoskeletal normal    Neurological normal    Lymphatics normal   Assessment and Plan:  Impression:   refractory B-cell lymphoma stage III in 74 year old female status post R. CHOP plus maintenance Rituxan  Plan:   at the stomach to go ahead with Zevalin therapy. I discussed the case personally with Dr. Grayland Ormond and we have presented her case at our weekly tumor conference. Risks and benefits of treatment were explained to the patient in detail. Side effects such as pancytopenia, fatigue, nausea diarrhea were all discussed in detail with the patient. We have scheduled her for her 1st clearance Rituxan early next week and will be ordering her drug for a menstruation 7 days later. She will be receiving the 0.4 mCi dose based on her platelet count above 150,000. Sequencing of her therapy was discussed and outlined for the patient.  I would like to take this opportunity to thank you for allowing me to continue to participate in this patient's care.  Electronic Signatures: Armstead Peaks (MD)  (Signed 22-Jul-13 10:28)  Authored: HPI, Diagnosis, Past Hx, PFSH, Allergies, Home Meds, ROS, Nursing Notes, Physical Exam, Encounter Assessment and Plan   Last Updated: 22-Jul-13 10:28 by Armstead Peaks (MD)

## 2014-08-13 NOTE — Consult Note (Signed)
PATIENT NAME:  Domenic PoliteCOLEY, Darby W MR#:  [id number] DATE OF BIRTH:  22-Dec-1940  DATE OF CONSULTATION:  01/12/2012  REFERRING PHYSICIAN:  Gerarda Fractionimothy Finnegan, MD  CONSULTING PHYSICIAN:  Shaune PollackQing Lenna Hagarty, MD PRIMARY CARE PHYSICIAN: Nonlocal.   REASON FOR CONSULTATION: Hypotension.  HISTORY OF PRESENT ILLNESS: The patient is a 74 year old Caucasian female with a history of lymphoma, hypertension, hyperlipidemia, diabetes, was admitted directly from Dr. Milinda CaveFinnegan's office due to shortness of breath, dizziness, and hypotension. The patient has had mild shortness of breath for several months but so far work-up is negative, including chest x-ray and echocardiogram. Her skin was cool and clammy. Her blood pressure was noted to be at 70/40, and she was treated with normal saline bolus. Blood pressure increased to about 100. The patient feels better now. She denies any fever or chills. No headache. No cough, sputum, or hemoptysis.  She denies any abdominal pain, nausea, vomiting, or diarrhea. No melena or bloody stool. No dysuria, hematuria, or incontinence. The patient denies any loss of consciousness, seizure, or syncope.   PAST MEDICAL HISTORY: As mentioned above:  1. Lymphoma. 2. Hypertension.  3. Diabetes.  4. Hyperlipidemia.   PAST SURGICAL HISTORY:  1. Cholecystectomy. 2. Hysterectomy.   FAMILY HISTORY:  1. Coronary artery disease.  2. Hypertension.   SOCIAL HISTORY: The patient denies any smoking or drinking or illicit drugs.     HOME MEDICATIONS:  1. Zocor 20 mg p.o. daily.  2. Metformin 1000 mg p.o. b.i.d.  3. Citalopram 20 mg p.o. daily.  4. Benazepril 10 mg p.o. daily.  5. Aspirin 81 mg p.o. daily.   PHYSICAL EXAMINATION:  VITALS: Temperature 97.2, blood pressure 110/73, pulse 90, oxygen saturation 100% on room air.   GENERAL: The patient is alert, awake, oriented, in no acute distress.   HEENT: Pupils are round, equal, reactive to light and accommodation. Moist oral mucosa. Clear  oropharynx.   NECK: Supple. No JVD, carotid bruit, but has lymphadenopathy.   CARDIOVASCULAR: S1, S2, regular rate and rhythm. No murmurs or gallops.   PULMONARY: Bilateral air entry. No wheezing or rales. No use of accessory muscles to breathe.   ABDOMEN: Soft. No distention or tenderness. No organomegaly. Bowel sounds present.   EXTREMITIES: No edema, clubbing, or cyanosis. No calf tenderness. Strong bilateral pedal pulses.   SKIN: No rash or jaundice.   NEUROLOGY: Alert and oriented  x3. No focal deficit. Power five out of five. Sensation intact.   LABORATORY, DIAGNOSTIC AND RADIOLOGICAL DATA: Glucose 123, BUN 26, creatinine 1.09, sodium 135, potassium 3.9, chloride 99, bicarbonate 26. WBC 2.2, hemoglobin 11.1, platelets 51. CT scan of the neck, chest, abdomen and pelvis shows interval progression of disease within the neck, chest, abdomen and pelvis.     IMPRESSION:  1. Hypotension, possibly due to dehydration.  2. Dehydration.  3. Diabetes.  4. Hyperlipidemia.  5. Pancytopenia.  6. History of hypertension.   RECOMMENDATIONS:  1. I agree with normal saline IV at 125 mL/h. Monitor vital signs closely. Hold benazepril. Follow-up CMP, CBC.  2. For diabetes, start sliding scale, hold metformin. 3. For lymphoma, follow up with Dr. Orlie DakinFinnegan.   I discussed the patient's situation and recommendations with the patient and the patient's husband.   TIME SPENT: About 55 minutes. ____________________________ Shaune PollackQing Gloristine Turrubiates, MD qc:cbb D: 01/12/2012 20:40:44 ET T: 01/13/2012 13:02:04 ET JOB#: [id number]  cc: Shaune PollackQing Rosmarie Esquibel, MD, <Dictator> Shaune PollackQING Clarisse Rodriges MD ELECTRONICALLY SIGNED 01/13/2012 15:46

## 2014-08-13 NOTE — H&P (Signed)
PATIENT NAME:  Penny Bradley, Penny Bradley MR#:  [id number] DATE OF BIRTH:  [birth year]  DATE OF ADMISSION:  02/25/2012  REFERRING PHYSICIAN: Dr. Suzzanne Cloudhristine Marie Braud  PRIMARY CARE PHYSICIAN: [facility]   CHIEF COMPLAINT: Shortness of breath.   HISTORY OF PRESENT ILLNESS: This is an unfortunate 74 year old female with significant past medical history of recurrent follicular lymphoma, been seen and actively treated by Dr. Orlie DakinFinnegan, history of hypertension, hyperlipidemia, diabetes, hyperlipidemia. Patient completed recently radiation treatment to her neck and abdomen with only mild improvement of her subcutaneous nodules, treatment was done on 02/15/2012 by Dr. Carmina MillerGlenn Chrystal as she is having some trouble breathing secondary to extrinsic compression of her trachea, which she developed some mild dysphasia after her therapy. Patient complains of progressive shortness of breath, not feeling well which prompted her to come to ED. In ED patient was severely anxious as well hypoxic where she had CT neck and chest done with IV contrast which did show a patent airway, as well showed multiple lung nodules and lymphadenopathy encasing the right bronchus with resulting narrowing of the bronchus to 3 mm. As well, discussed with radiologist, Dr. Orlie DakinBehar, who reviewed the scan for PE and he told me it is negative for PE where there is no negative for PE on the CAT scan, but patient currently requiring nonrebreather bag, has significant stridor without much improvement of racemic AP, IV Decadron and IV Xopenex so consulted ENT, Dr. Elenore RotaJuengel, to evaluate the patient at this point. Patient denies any fever, any productive cough or sputum, nausea, vomiting, or abdominal pain. Discussed CODE STATUS with the patient and her husband at bedside who still wishes to proceed with FULL CODE status at this point.   PAST MEDICAL HISTORY:  1. Recurrent follicular lymphoma status post radiation treatment 10/22 and chemotherapy.   2. Hypertension.  3. Diabetes.  4. Hyperlipidemia.   PAST SURGICAL HISTORY:  1. Cholecystectomy.  2. Hysterectomy.   FAMILY HISTORY: Coronary artery disease, hypertension.   SOCIAL HISTORY: Denies any history of smoking, drinking or illicit drugs.   HOME MEDICATIONS:  1. Simvastatin 40 mg oral daily.  2. Prochlorperazine 10 mg every six hours as needed.  3. Colace and senna 1 tablet oral daily.  4. Metformin 1 gram oral 2 times a day.  5. Hydrocodone 1 tablet every six hours as needed.  6. Decadron 4 mg oral 2 times a day.  7. Citalopram 20 mg oral daily.  8. Benazepril 10 mg oral daily.  9. Aspirin 81 mg oral daily.   REVIEW OF SYSTEMS: CONSTITUTIONAL: Denies any fever. Complains of fatigue and weakness. EYES: Denies blurry vision, double vision or pain. ENT: Denies tinnitus, ear pain, hearing loss. Complains of hoarseness. RESPIRATORY: Denies any cough. Complains of stridor, shortness of breath, dysphonia and some mild dysphagia. CARDIOVASCULAR: Denies chest pain, edema, arrhythmia, palpitations, syncope. GASTROINTESTINAL: Denies nausea, vomiting, diarrhea, abdominal pain, hematemesis. GENITOURINARY: Denies dysuria, hematuria, renal colic. ENDO: Denies polyuria, polydipsia, heat or cold intolerance. INTEGUMENTARY: Denies any acne or rash. MUSCULOSKELETAL: Denies any swelling, gout, redness or arthritis or cramps. NEUROLOGIC: Denies numbness, dysarthria, epilepsy, tremors, vertigo. PSYCH: Has some anxiety. Denies any insomnia, bipolar disorder, substance or alcohol abuse.   PHYSICAL EXAMINATION:  VITAL SIGNS: Temperature 97.8, pulse 132, respiratory rate 24, blood pressure 137/68, saturating 98% on nonrebreather.   GENERAL: Frail, elderly female in respiratory distress sitting on bed.   HEENT: Head atraumatic, normocephalic. Pupils equal, reactive to light. Pink conjunctivae. Anicteric sclerae. Moist oral mucosa.   NECK: Has  left neck mass. Has stridor bilaterally, mainly in the  right side.  LUNGS: Patient had decreased air entry in the right side with rales in the right side as well.   CARDIOVASCULAR: S1, S2 heard. No rubs, murmur, gallops. Tachycardic.   ABDOMEN: Soft, nontender, nondistended. Bowel sounds present.   EXTREMITIES: No edema. No clubbing. No cyanosis.   SKIN: Clammy with sweating. No rash.   PSYCHIATRIC: Anxious, awake, alert x3. Intact judgment and insight.   NEUROLOGICAL: Grossly intact. Motor 5/5. No focal deficits.   LABORATORY, DIAGNOSTIC AND RADIOLOGICAL DATA: Glucose 296, BUN 20, creatinine 0.83, sodium 131, potassium 3.9, chloride 91, CO2 32, white blood cells 13.2, hemoglobin 11.2, hematocrit 32, platelets 143. pH 7.31, pCO2 44, pO2 45, FiO2 100.   CT chest and neck with IV contrast showing interval improvement of left cervical lymphadenopathy with subcarinal lymphadenopathy encasing the right bronchus narrowing it to 3 mm and posterior mediastinal lymphadenopathy and retroperitoneal lymphadenopathy with multiple bilateral pulmonary nodules indicating metastasis and right posterior thoracic wall soft tissue implant consistent with metastasis. Discussed this with Dr. Orlie Dakin over the phone who reported there is no evidence of PE on the CT chest.    ASSESSMENT AND PLAN: This is a 74 year old female with recurrence of lymphoma received chemotherapy and radiation therapy, presents with stridor with progressive shortness of breath, having acute respiratory failure hypoxic, appears to be presenting with upper airway compromise, as well right bronchus compromise secondary to metastatic lesions and extrinsic pressure on the upper airway. 1. Stridor with shortness of breath. Actually ENT physician just examined the patient via laryngoscopy where he noticed vocal cord paralysis where he only saw 2 mm opening so he will take patient to Operating Room for tracheostomy placement to bypass the upper airway obstruction where patient will have her airway secured  by tracheostomy tube.  2. Acute hypoxic respiratory failure. This appears to be multifactorial due to upper airway obstruction, due to vocal cord paresis, as well due to metastatic lung disease with right bronchus obstruction. Will continue with nebulizer treatments. Racemic AP as needed and p.r.n. albuterol treatment. Consulted Dr. Freda Munro. He will evaluate the patient.  3. Recurrent follicular lymphoma. Will consult oncology, Dr. Orlie Dakin, as well radiation oncology at this point. Patient appears to be having metastatic disease as she is having lung nodules in her CT chest.  4. Hypertension. Will hold medications until patient is more stable.  5. Diabetes. Will hold medication as patient received IV contrast so no metformin, will resume to take p.o. intake will have her on sliding insulin.  6. Hyperlipidemia. Hold statin until more stable.  7. Deep vein thrombosis prophylaxis. Sub-Q heparin.  8. CODE STATUS discussed at length with husband at bedside and patient and she wishes to remain FULL CODE.   TOTAL TIME SPENT ON PATIENT ADMISSION AND CARE: 55 minutes.   ____________________________ Starleen Arms, MD dse:cms D: 02/25/2012 08:11:31 ET T: 02/25/2012 08:30:52 ET JOB#: [id number]  cc: Starleen Arms, MD, <Dictator> [facility] Zaliyah Meikle Teena Irani MD ELECTRONICALLY SIGNED 02/29/2012 0:11

## 2014-08-13 NOTE — Consult Note (Signed)
History of Present Illness:   Reason for Consult Progressive follicular lymphoma now with tracheostomy.    HPI   Patient last evaluated in clinic on February 23, 2012.  At that point, she was complaining of hoarseness of voice and mild shortness of breath, but otherwise felt well. Over the last 24-48 hours, patient had a more difficulty breathing and developed stridor.  She subsequently required emergent tracheostomy to protect her airway.  Currently, she is lethargic and review of system is difficult.  PFSH:   Additional Past Medical and Surgical History Past medical history: Hypertension, hyperlipidemia, diabetes.  Past surgical history: Cholecystectomy, hysterectomy.  Family history: CAD, hypertension.  Social history: Tobacco use as above.  Denies alcohol.   Review of Systems:   Performance Status (ECOG) 3    Review of Systems   As per HPI. Otherwise, 10 point system review was negative.  NURSING NOTES: **Vital Signs.:   01-Nov-13 09:51    Pulse Pulse: 116    Pulse source if not from Vital Sign Device: per cardiac monitor    Respirations Respirations: 28    Systolic BP Systolic BP: 95    Diastolic BP (mmHg) Diastolic BP (mmHg): 55    Mean BP: 68    BP Source  if not from Vital Sign Device: non-invasive    Oxygen Delivery: Trach Aerosol Mask; 100%    Nurse Fingerstick (mg/dL) FSBS (fasting range 65-99 mg/dL): 364    Comments/Interventions: Per Hyperglycemia Protocol ([facility])   Physical Exam:   Physical Exam General: ill-appearing, no acute distress. Eyes: Pink conjunctiva, anicteric sclera. HEENT: tracheostomy tube in place, left cervical lymph node still evident, but decreased in size. Lungs: Clear to auscultation bilaterally. Heart: Regular rate and rhythm. No rubs, murmurs, or gallops. Abdomen: subcutaneous nodule easily palpable smaller than previous. Musculoskeletal: No edema, cyanosis, or clubbing. Neuro: lethargic Skin: easily palpable subcutaneous  nodule on left thigh.    No Known Allergies:     benazepril 10 mg oral tablet: 1 tab(s) orally once a day in AM, Active, 0, None   aspirin 81 mg oral tablet: 1 tab(s) orally once a day, Active, 0, None   citalopram 20 mg oral tablet: 1 tab(s) orally once a day, Active, 0, None   metformin 1000 mg oral tablet: 1 tab(s) orally 2 times a day, Active, 0, None   simvastatin 40 mg oral tablet: 1 tab(s) orally once a day (at bedtime), Active, 0, None   dexamethasone 4 mg oral tablet: 1 tab(s) orally 2 times a day x7 days then 1 tab daily x7 days then cont next rx, Active, 0, None   prochlorperazine 10 mg oral tablet: 1 tab(s) orally every 6 hours, As Needed- for Nausea, Vomiting , Active, 0, None   hydrocodone: 1 tab(s) orally every 6 hours, As Needed- for Pain , Active, 0, None   Peri-Colace 50 mg-8.6 mg oral tablet: 2 tab(s) orally once a day (at bedtime), Active, 0, None  Laboratory Results: Hepatic:  01-Nov-13 02:47    Bilirubin, Total 0.3   Alkaline Phosphatase 74   SGPT (ALT) 19   SGOT (AST) 16   Total Protein, Serum 7.3   Albumin, Serum 3.8  Routine Chem:  01-Nov-13 02:47    Glucose, Serum  296   BUN  20   Creatinine (comp) 0.83   Sodium, Serum  131   Potassium, Serum 3.9   Chloride, Serum  91   CO2, Serum 32   Calcium (Total), Serum 9.5   Osmolality (calc) 276     eGFR (African American) >60   eGFR (Non-African American) >60 (eGFR values <44m/min/1.73 m2 may be an indication of chronic kidney disease (CKD). Calculated eGFR is useful in patients with stable renal function. The eGFR calculation will not be reliable in acutely ill patients when serum creatinine is changing rapidly. It is not useful in  patients on dialysis. The eGFR calculation may not be applicable to patients at the low and high extremes of body sizes, pregnant women, and vegetarians.)   Anion Gap 8  Routine Coag:  01-Nov-13 02:47    Prothrombin 12.5   INR 0.9 (INR reference interval applies to  patients on anticoagulant therapy. A single INR therapeutic range for coumarins is not optimal for all indications; however, the suggested range for most indications is 2.0 - 3.0. Exceptions to the INR Reference Range may include: Prosthetic heart valves, acute myocardial infarction, prevention of myocardial infarction, and combinations of aspirin and anticoagulant. The need for a higher or lower target INR must be assessed individually. Reference: The Pharmacology and Management of the Vitamin K  antagonists: the seventh ACCP Conference on Antithrombotic and Thrombolytic Therapy. CEPPIR.5188Sept:126 (3suppl): [id number] A HCT value >55% may artifactually increase the PT.  In one study,  the increase was an average of 25%. Reference:  "Effect on Routine and Special Coagulation Testing Values of Citrate Anticoagulant Adjustment in Patients with High HCT Values." American Journal of Clinical Pathology 2006;126:400-405.)  Routine Hem:  01-Nov-13 02:47    WBC (CBC)  13.2   RBC (CBC)  3.55   Hemoglobin (CBC)  11.2   Hematocrit (CBC)  32.0   Platelet Count (CBC)  143 (Result(s) reported on 25 Feb 2012 at 02:56AM.)   MCV 90   MCH 31.7   MCHC 35.1   RDW  16.0   Assessment and Plan:  Impression:   Recurrent/progressive follicular lymphoma.  Plan:   1.  Follicular lymphoma: Patient has progressive disease.  She has a PET scan scheduled for Monday.  [facility]  She will be required to reinitiate treatment with chemotherapy in the next several weeks. Pancytopenia: improving. Secondary to Zelavin, monitor. 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  Tracheostomy in place, appreciate ENT input. consult, will follow.   CC Referral:   cc: ECleda Daub PA-C   Electronic Signatures: FDelight Hoh(MD)  (Signed [phone] [id number]:03)  Authored: HISTORY OF PRESENT ILLNESS, PFSH, ROS, NURSING NOTES, PE, ALLERGIES, HOME  MEDICATIONS, LABS, ASSESSMENT AND PLAN, CC Referring Physician   Last Updated: 01-Nov-13 13:03 by FDelight Hoh(MD)

## 2014-08-13 NOTE — Consult Note (Signed)
Comments   I met with pt's husband, son, mother and 2 sisters. Updated them on pt's current medical condition. They are awaiting results of PET scan to be done today. However, they are now aware that pt's disease is progressing on chemo and that prognosis is poor. They realize that they will likely need to make decisions for pt if she is unable to make them for herself. I will follow up with them this afternoon after PET to discuss goals of medical therapy further.   Electronic Signatures: Mckenley Birenbaum, Izora Gala (MD)  (Signed [id number] 11:28)  Authored: Palliative Care   Last Updated: 04-Nov-13 11:28 by Annalis Kaczmarczyk, Izora Gala (MD)

## 2014-08-13 NOTE — Op Note (Signed)
PATIENT NAME:  Penny PoliteCOLEY, Creasie W MR#:  [id number] DATE OF BIRTH:  03-16-1941  DATE OF PROCEDURE:  02/25/2012  PREOPERATIVE DIAGNOSES:  1. Acute airway obstruction. 2. Bilateral vocal cord paresis. 3. Metastatic lymphoma with recent start of radiation therapy.   POSTOPERATIVE DIAGNOSES: 1. Acute airway obstruction. 2. Bilateral vocal cord paresis. 3. Metastatic lymphoma with recent start of radiation therapy.   OPERATIVE PROCEDURE: Emergent tracheostomy for establishing airway.   SURGEON:  Cammy CopaPaul H. Kippy Gohman, M.D.  ANESTHESIA:  General.   COMPLICATIONS: None.  TOTAL ESTIMATED BLOOD LOSS: Minimal.   DESCRIPTION OF PROCEDURE:  The patient was brought to the Emergency Room. She was on a nonrebreather mask. She had bilateral vocal paresis with inspiratory stridor. She was given general anesthesia by oral endotracheal intubation. Once the trach tube was placed, her head was extended back a little bit. The neck was marked and prepped with alcohol. 2 mL of 1% Xylocaine with Epi 1:100,000 were used for infiltration of the skin and subcutaneous area. She was prepped and draped in sterile fashion.   An incision was made through the skin and platysma tissues down to the strap muscles. These were divided in the midline. There was a small vessel that ran vertically in the midline and this was cut with the harmonic scalpel. The tissues were divided and pretracheal space was found. She has a long thin neck and it was in the neck below the thyroid isthmus that was relatively narrow. A trach cuff was placed beneath the thyroid isthmus and lifted up to the cricoid ring. This stabilized the anterior trachea. An incision was marked below the third tracheal ring with oxygen only at 40%, cautery was used to help control any bleeding along the anterior trachea. The trachea was entered. The cuff was deflated and an inferior base U-shaped flap was created through the 4th ring. This was then sutured to the anterior tracheal  wall with a 4-0 Vicryl. This left a good opening in the trachea and a secure airway. The trachea appeared to be healthy. The trach tube was pulled back and then a #6 Shiley DCT tube was placed under direct vision. The cuff was inflated and was secured around the neck. She was oxygenating very well through this. The trach tube was then secured using 2-0 nylon sutures to the skin superiorly and then a sponge dressing around to her neck. A trach dressing was then applied at the trach site. The area was very dry. There was minimal blood loss at all, less than 5 milliliters. The patient was awakened and taken to the recovery room in satisfactory condition. There were no operative complications.   ____________________________ Cammy CopaPaul H. Sandeep Radell, MD phj:ap D: 02/25/2012 09:13:30 ET T: 02/25/2012 09:34:18 ET JOB#: [id number]  cc: Cammy CopaPaul H. Jocie Meroney, MD, <Dictator> Cammy CopaPAUL H Aurianna Earlywine MD ELECTRONICALLY SIGNED 03/06/2012 12:55
